# Patient Record
Sex: Female | Born: 1945 | Race: Black or African American | Hispanic: No | Marital: Single | Smoking: Never smoker
Health system: Southern US, Community
[De-identification: ages and names within clinical notes are randomized; demographics above are authoritative.]

## PROBLEM LIST (undated history)

## (undated) DIAGNOSIS — F419 Anxiety disorder, unspecified: Secondary | ICD-10-CM

## (undated) DIAGNOSIS — E785 Hyperlipidemia, unspecified: Secondary | ICD-10-CM

## (undated) DIAGNOSIS — N189 Chronic kidney disease, unspecified: Secondary | ICD-10-CM

## (undated) DIAGNOSIS — F32A Depression, unspecified: Secondary | ICD-10-CM

## (undated) DIAGNOSIS — C801 Malignant (primary) neoplasm, unspecified: Secondary | ICD-10-CM

## (undated) DIAGNOSIS — Z5189 Encounter for other specified aftercare: Secondary | ICD-10-CM

## (undated) DIAGNOSIS — D649 Anemia, unspecified: Secondary | ICD-10-CM

## (undated) DIAGNOSIS — I10 Essential (primary) hypertension: Secondary | ICD-10-CM

## (undated) DIAGNOSIS — F329 Major depressive disorder, single episode, unspecified: Secondary | ICD-10-CM

## (undated) DIAGNOSIS — N764 Abscess of vulva: Secondary | ICD-10-CM

## (undated) HISTORY — DX: Encounter for other specified aftercare: Z51.89

## (undated) HISTORY — PX: DILATION AND CURETTAGE OF UTERUS: SHX78

## (undated) HISTORY — PX: INCISION AND DRAINAGE: SHX5863

## (undated) HISTORY — PX: ABLATION: SHX5711

## (undated) HISTORY — DX: Abscess of vulva: N76.4

## (undated) HISTORY — DX: Essential (primary) hypertension: I10

---

## 1978-03-06 DIAGNOSIS — Z5189 Encounter for other specified aftercare: Secondary | ICD-10-CM

## 1978-03-06 HISTORY — DX: Encounter for other specified aftercare: Z51.89

## 1990-03-06 DIAGNOSIS — N189 Chronic kidney disease, unspecified: Secondary | ICD-10-CM

## 1990-03-06 HISTORY — PX: KIDNEY SURGERY: SHX687

## 1990-03-06 HISTORY — DX: Chronic kidney disease, unspecified: N18.9

## 2001-11-01 ENCOUNTER — Other Ambulatory Visit: Admission: RE | Admit: 2001-11-01 | Discharge: 2001-11-01 | Payer: Self-pay | Admitting: *Deleted

## 2005-03-22 ENCOUNTER — Ambulatory Visit: Payer: Self-pay | Admitting: Internal Medicine

## 2005-03-28 ENCOUNTER — Ambulatory Visit: Payer: Self-pay | Admitting: Internal Medicine

## 2005-04-07 ENCOUNTER — Ambulatory Visit: Payer: Self-pay | Admitting: Family Medicine

## 2005-04-14 ENCOUNTER — Ambulatory Visit: Payer: Self-pay | Admitting: Internal Medicine

## 2005-04-21 ENCOUNTER — Ambulatory Visit: Payer: Self-pay | Admitting: Internal Medicine

## 2005-04-21 LAB — CONVERTED CEMR LAB
Cholesterol: 267 mg/dL
LDL Cholesterol: 186 mg/dL
Pap Smear: NORMAL

## 2005-05-19 ENCOUNTER — Ambulatory Visit: Payer: Self-pay | Admitting: Internal Medicine

## 2005-06-21 ENCOUNTER — Ambulatory Visit: Payer: Self-pay | Admitting: *Deleted

## 2005-06-21 ENCOUNTER — Ambulatory Visit: Payer: Self-pay | Admitting: Internal Medicine

## 2005-06-22 ENCOUNTER — Ambulatory Visit: Payer: Self-pay | Admitting: Internal Medicine

## 2005-06-26 ENCOUNTER — Ambulatory Visit: Payer: Self-pay | Admitting: Internal Medicine

## 2005-10-06 ENCOUNTER — Ambulatory Visit: Payer: Self-pay | Admitting: Internal Medicine

## 2005-10-31 ENCOUNTER — Ambulatory Visit: Payer: Self-pay | Admitting: Family Medicine

## 2005-11-24 ENCOUNTER — Ambulatory Visit: Payer: Self-pay | Admitting: Internal Medicine

## 2006-06-27 ENCOUNTER — Ambulatory Visit: Payer: Self-pay | Admitting: Internal Medicine

## 2006-10-24 ENCOUNTER — Encounter (INDEPENDENT_AMBULATORY_CARE_PROVIDER_SITE_OTHER): Payer: Self-pay | Admitting: Internal Medicine

## 2006-10-24 DIAGNOSIS — F329 Major depressive disorder, single episode, unspecified: Secondary | ICD-10-CM

## 2006-10-24 DIAGNOSIS — F411 Generalized anxiety disorder: Secondary | ICD-10-CM | POA: Insufficient documentation

## 2006-11-12 ENCOUNTER — Telehealth (INDEPENDENT_AMBULATORY_CARE_PROVIDER_SITE_OTHER): Payer: Self-pay | Admitting: *Deleted

## 2006-11-21 ENCOUNTER — Encounter (INDEPENDENT_AMBULATORY_CARE_PROVIDER_SITE_OTHER): Payer: Self-pay | Admitting: *Deleted

## 2006-12-17 ENCOUNTER — Ambulatory Visit: Payer: Self-pay | Admitting: Internal Medicine

## 2006-12-17 LAB — CONVERTED CEMR LAB
BUN: 17 mg/dL (ref 6–23)
CO2: 25 meq/L (ref 19–32)
Chloride: 100 meq/L (ref 96–112)
Creatinine, Ser: 0.97 mg/dL (ref 0.40–1.20)
Glucose, Bld: 81 mg/dL (ref 70–99)
LDL Cholesterol: 147 mg/dL — ABNORMAL HIGH (ref 0–99)
Potassium: 3.9 meq/L (ref 3.5–5.3)
Triglycerides: 76 mg/dL (ref <150)
VLDL: 15 mg/dL (ref 0–40)

## 2007-06-20 ENCOUNTER — Ambulatory Visit: Payer: Self-pay | Admitting: Internal Medicine

## 2007-06-20 LAB — CONVERTED CEMR LAB
ALT: 15 units/L (ref 0–35)
AST: 20 units/L (ref 0–37)
Calcium: 9.3 mg/dL (ref 8.4–10.5)
Chloride: 100 meq/L (ref 96–112)
Creatinine, Ser: 1.03 mg/dL (ref 0.40–1.20)
Sodium: 140 meq/L (ref 135–145)
Total Bilirubin: 0.5 mg/dL (ref 0.3–1.2)
Total CHOL/HDL Ratio: 3.7
Total Protein: 9.1 g/dL — ABNORMAL HIGH (ref 6.0–8.3)
VLDL: 22 mg/dL (ref 0–40)

## 2007-06-27 ENCOUNTER — Ambulatory Visit: Payer: Self-pay | Admitting: Internal Medicine

## 2007-07-25 ENCOUNTER — Ambulatory Visit: Payer: Self-pay | Admitting: Internal Medicine

## 2007-09-24 ENCOUNTER — Ambulatory Visit: Payer: Self-pay | Admitting: Internal Medicine

## 2008-03-31 ENCOUNTER — Ambulatory Visit: Payer: Self-pay | Admitting: Internal Medicine

## 2008-03-31 LAB — CONVERTED CEMR LAB
HDL: 48 mg/dL (ref >39)
Hemoglobin: 11.7 g/dL — ABNORMAL LOW (ref 12.0–15.0)
LDL Cholesterol: 92 mg/dL (ref 0–99)
Lymphs Abs: 1 10*3/uL (ref 0.7–4.0)
Monocytes Relative: 11 % (ref 3–12)
Neutro Abs: 2.2 10*3/uL (ref 1.7–7.7)
Neutrophils Relative %: 60 % (ref 43–77)
RBC: 4.12 M/uL (ref 3.87–5.11)
Total CHOL/HDL Ratio: 3.5
Triglycerides: 143 mg/dL (ref <150)
VLDL: 29 mg/dL (ref 0–40)
WBC: 3.6 10*3/uL — ABNORMAL LOW (ref 4.0–10.5)

## 2008-04-08 ENCOUNTER — Ambulatory Visit: Payer: Self-pay | Admitting: Internal Medicine

## 2008-05-04 ENCOUNTER — Ambulatory Visit (HOSPITAL_COMMUNITY): Admission: RE | Admit: 2008-05-04 | Discharge: 2008-05-04 | Payer: Self-pay | Admitting: Internal Medicine

## 2008-08-27 ENCOUNTER — Emergency Department (HOSPITAL_COMMUNITY): Admission: EM | Admit: 2008-08-27 | Discharge: 2008-08-27 | Payer: Self-pay | Admitting: Emergency Medicine

## 2009-05-06 ENCOUNTER — Ambulatory Visit: Payer: Self-pay | Admitting: Internal Medicine

## 2009-05-06 LAB — CONVERTED CEMR LAB
ALT: 18 units/L (ref 0–35)
AST: 21 units/L (ref 0–37)
Alkaline Phosphatase: 53 units/L (ref 39–117)
Creatinine, Ser: 1.01 mg/dL (ref 0.40–1.20)
Microalb, Ur: 4.26 mg/dL — ABNORMAL HIGH (ref 0.00–1.89)
Total Bilirubin: 0.5 mg/dL (ref 0.3–1.2)

## 2009-10-11 ENCOUNTER — Ambulatory Visit: Payer: Self-pay | Admitting: Internal Medicine

## 2010-01-24 IMAGING — CT CT NECK W/ CM
3 series · 16 of 33 positions shown, 19 images · IV contrast (100 ML OMNI 300)
Comparison: None

CLINICAL DATA: Dysphagia.  Sore throat.

CT NECK WITH CONTRAST
TECHNIQUE: Multidetector CT imaging of the neck was performed with
intravenous contrast.
Contrast: 100 ml Vmnipaque-4UU IV

[Series 2: 2cc/(id)/1cc/(id) · axial · 0.40mm/px · z∈[-268,-28]mm · 8 of 76 slices shown, 10 images]
[im 6/76  soft-tissue]
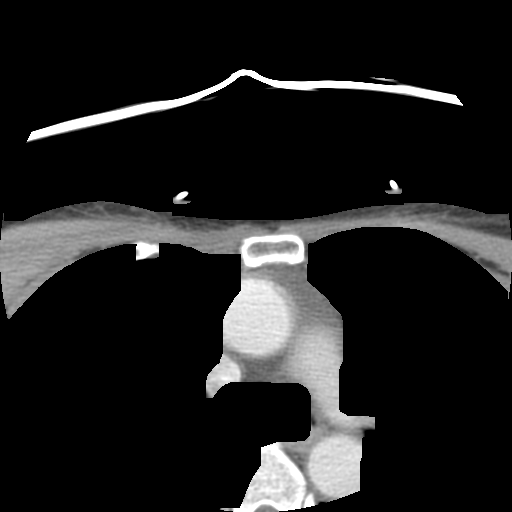
[im 6/76  bone]
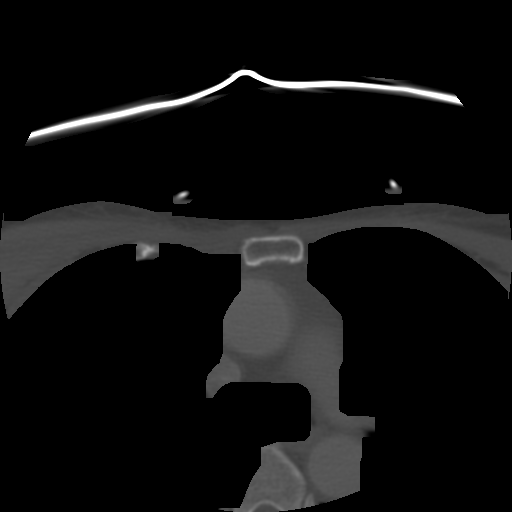
[im 18/76  bone]
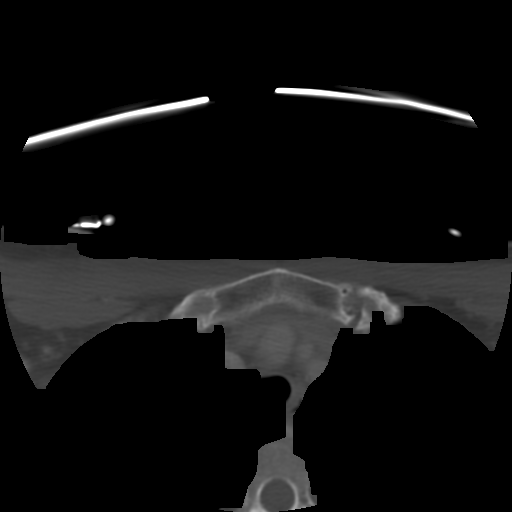
[im 24/76  bone]
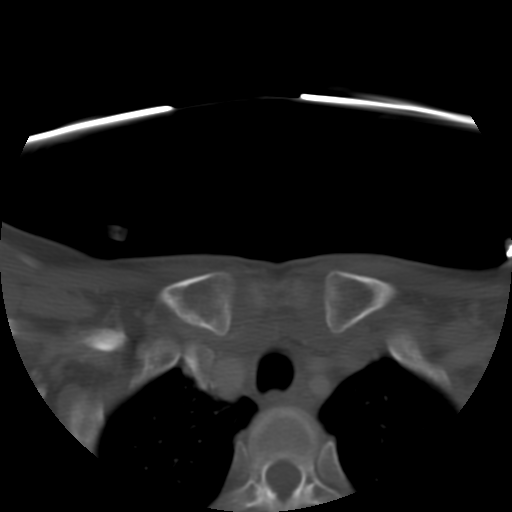
[im 35/76  bone]
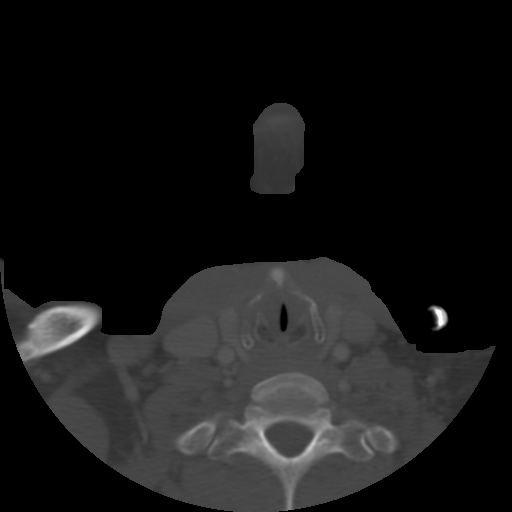
[im 41/76  soft-tissue]
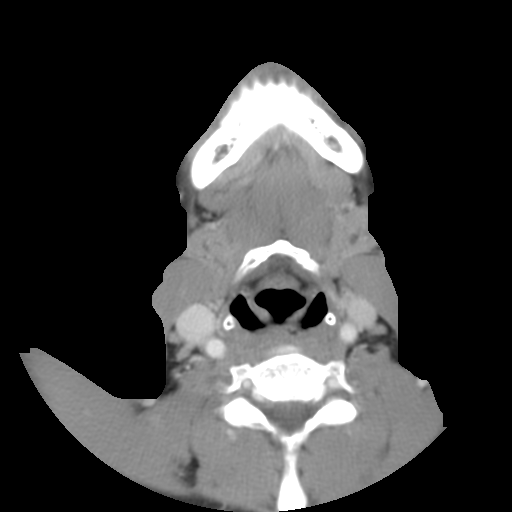
[im 41/76  bone]
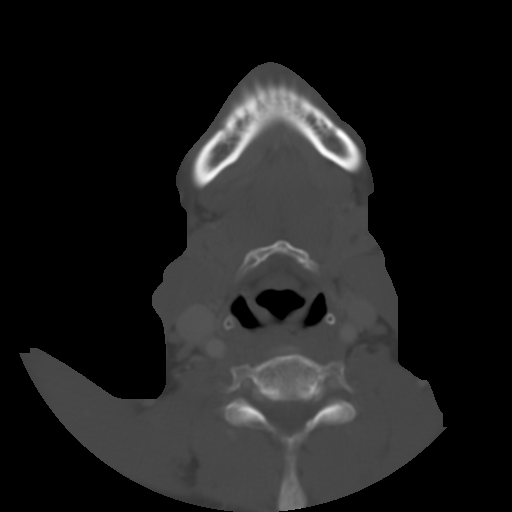
[im 52/76  bone]
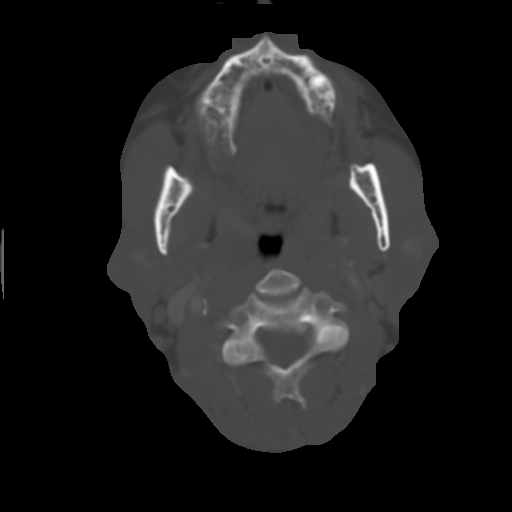
[im 58/76  bone]
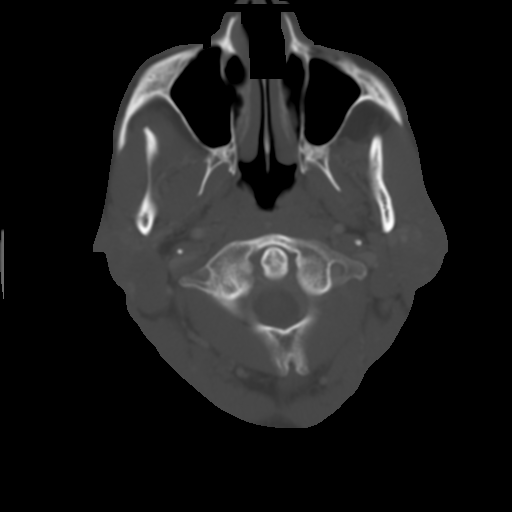
[im 70/76  bone]
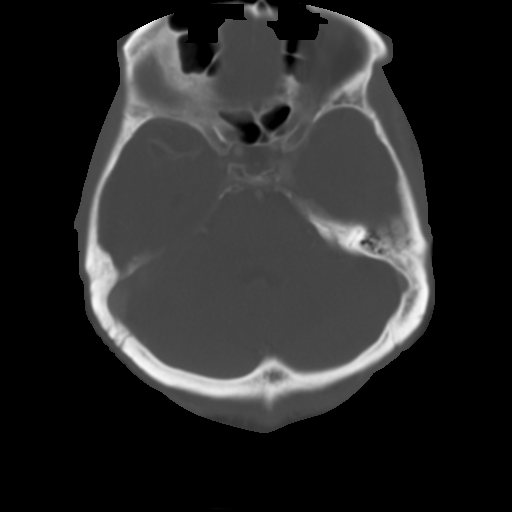

[Series 300: coronals · coronal · 0.56mm/px · 3 of 92 slices shown]
[im 19/92  bone]
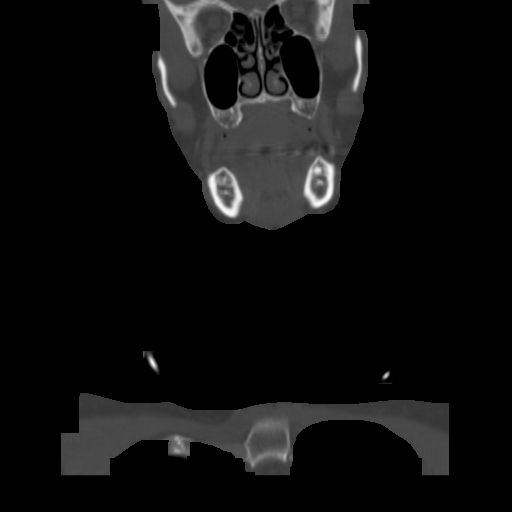
[im 37/92  bone]
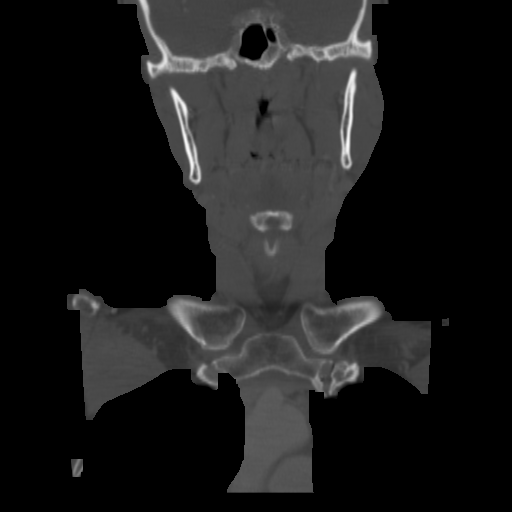
[im 55/92  bone]
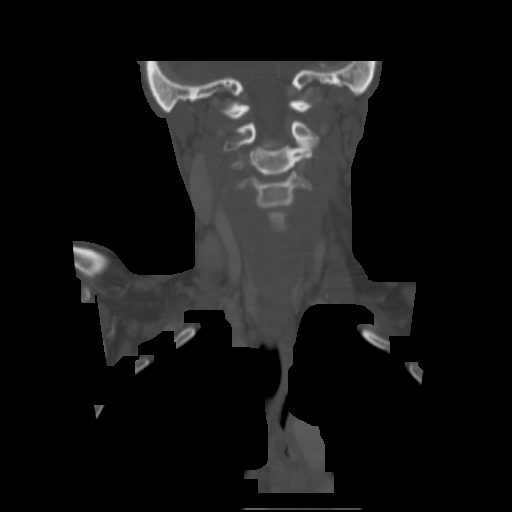

[Series 301: sagittals · sagittal · 0.56mm/px · 5 of 74 slices shown, 6 images]
[im 25/74  bone]
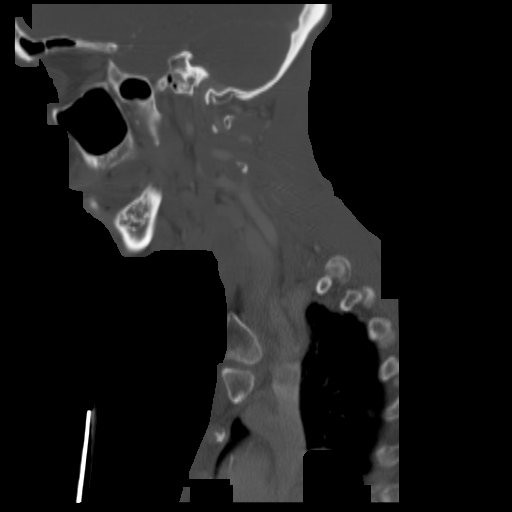
[im 31/74  bone]
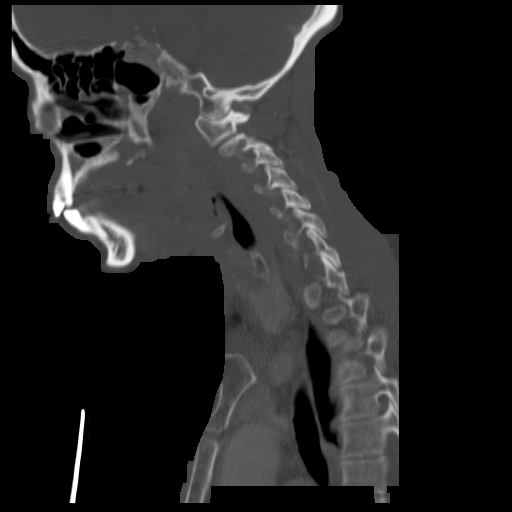
[im 37/74  soft-tissue]
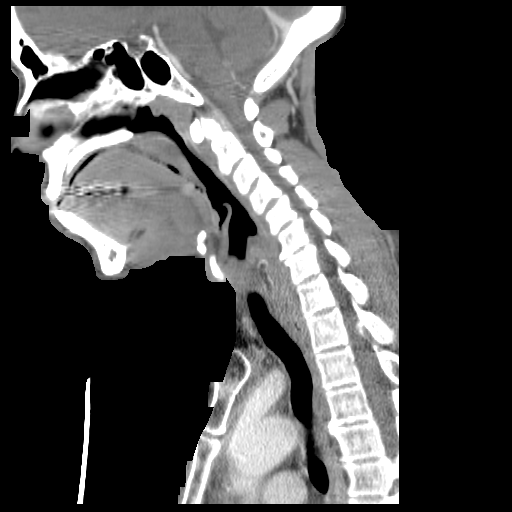
[im 37/74  bone]
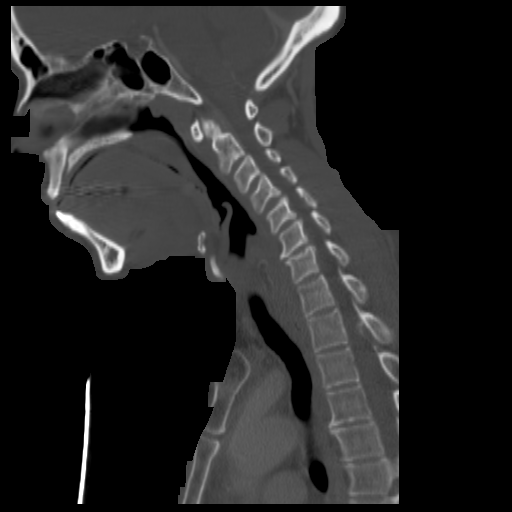
[im 43/74  bone]
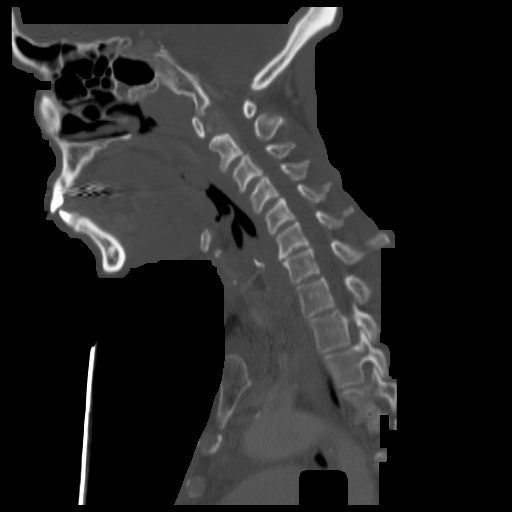
[im 49/74  bone]
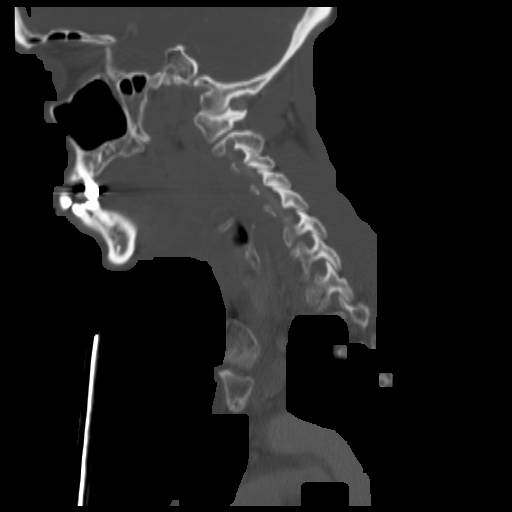

[16 of 33 positions shown; findings below may reference images not displayed]

FINDINGS: No mass or abnormal fluid collection.  Slightly prominent
sized internal jugular nodes and posterior triangle nodes possibly
due to hyperplasia/reactive nodes.  Slightly prominent sized nodes
in the left supraclavicular region.  Also prominent nodes in the
prevascular space just lateral to the transverse aortic arch.
Unremarkable appearance of the nasopharynx and oropharynx. No
tonsillar abnormality.  No glottic or paraglottic abnormality.
IMPRESSION: Mild adenopathy as described above.  No evidence of deep space
infection.

## 2010-06-13 LAB — RAPID STREP SCREEN (MED CTR MEBANE ONLY): Streptococcus, Group A Screen (Direct): NEGATIVE

## 2010-06-13 LAB — POCT I-STAT, CHEM 8
BUN: 11 mg/dL (ref 6–23)
Creatinine, Ser: 1.3 mg/dL — ABNORMAL HIGH (ref 0.4–1.2)
Hemoglobin: 13.3 g/dL (ref 12.0–15.0)
Potassium: 3.6 mEq/L (ref 3.5–5.1)
Sodium: 137 mEq/L (ref 135–145)
TCO2: 32 mmol/L (ref 0–100)

## 2010-06-13 LAB — DIFFERENTIAL
Basophils Relative: 0 % (ref 0–1)
Eosinophils Absolute: 0 10*3/uL (ref 0.0–0.7)
Lymphs Abs: 0.8 10*3/uL (ref 0.7–4.0)
Monocytes Relative: 11 % (ref 3–12)
Neutro Abs: 4 10*3/uL (ref 1.7–7.7)
Neutrophils Relative %: 75 % (ref 43–77)

## 2010-06-13 LAB — CBC
Hemoglobin: 12.1 g/dL (ref 12.0–15.0)
MCV: 92.5 fL (ref 78.0–100.0)
Platelets: 206 10*3/uL (ref 150–400)

## 2010-07-11 ENCOUNTER — Other Ambulatory Visit (HOSPITAL_COMMUNITY): Payer: Self-pay | Admitting: Family Medicine

## 2010-07-11 DIAGNOSIS — Z1231 Encounter for screening mammogram for malignant neoplasm of breast: Secondary | ICD-10-CM

## 2010-07-18 ENCOUNTER — Emergency Department (HOSPITAL_COMMUNITY)
Admission: EM | Admit: 2010-07-18 | Discharge: 2010-07-19 | Disposition: A | Discharge status: Home / Self Care | Payer: Medicare Other | Attending: Emergency Medicine | Admitting: Emergency Medicine

## 2010-07-18 ENCOUNTER — Emergency Department (HOSPITAL_COMMUNITY): Payer: Medicare Other

## 2010-07-18 DIAGNOSIS — D259 Leiomyoma of uterus, unspecified: Secondary | ICD-10-CM | POA: Insufficient documentation

## 2010-07-18 DIAGNOSIS — R142 Eructation: Secondary | ICD-10-CM | POA: Insufficient documentation

## 2010-07-18 DIAGNOSIS — I10 Essential (primary) hypertension: Secondary | ICD-10-CM | POA: Insufficient documentation

## 2010-07-18 DIAGNOSIS — R109 Unspecified abdominal pain: Secondary | ICD-10-CM | POA: Insufficient documentation

## 2010-07-18 DIAGNOSIS — I517 Cardiomegaly: Secondary | ICD-10-CM | POA: Insufficient documentation

## 2010-07-18 DIAGNOSIS — R141 Gas pain: Secondary | ICD-10-CM | POA: Insufficient documentation

## 2010-07-18 DIAGNOSIS — E7800 Pure hypercholesterolemia, unspecified: Secondary | ICD-10-CM | POA: Insufficient documentation

## 2010-07-18 LAB — HEPATIC FUNCTION PANEL
ALT: 18 U/L (ref 0–35)
AST: 25 U/L (ref 0–37)
Albumin: 3.8 g/dL (ref 3.5–5.2)
Alkaline Phosphatase: 67 U/L (ref 39–117)
Total Bilirubin: 0.3 mg/dL (ref 0.3–1.2)

## 2010-07-18 LAB — BASIC METABOLIC PANEL
CO2: 30 mEq/L (ref 19–32)
Calcium: 9.5 mg/dL (ref 8.4–10.5)
Chloride: 97 mEq/L (ref 96–112)
GFR calc Af Amer: 48 mL/min — ABNORMAL LOW (ref >60)
Glucose, Bld: 111 mg/dL — ABNORMAL HIGH (ref 70–99)
Potassium: 3.4 mEq/L — ABNORMAL LOW (ref 3.5–5.1)
Sodium: 137 mEq/L (ref 135–145)

## 2010-07-18 LAB — DIFFERENTIAL
Basophils Absolute: 0 10*3/uL (ref 0.0–0.1)
Basophils Relative: 0 % (ref 0–1)
Lymphocytes Relative: 44 % (ref 12–46)
Neutro Abs: 1.4 10*3/uL — ABNORMAL LOW (ref 1.7–7.7)
Neutrophils Relative %: 42 % — ABNORMAL LOW (ref 43–77)

## 2010-07-18 LAB — CBC
HCT: 42.7 % (ref 36.0–46.0)
Hemoglobin: 14.5 g/dL (ref 12.0–15.0)
RBC: 4.74 MIL/uL (ref 3.87–5.11)
WBC: 3.4 10*3/uL — ABNORMAL LOW (ref 4.0–10.5)

## 2010-07-18 LAB — POCT PREGNANCY, URINE: Preg Test, Ur: NEGATIVE

## 2010-07-19 LAB — URINALYSIS, ROUTINE W REFLEX MICROSCOPIC
Ketones, ur: NEGATIVE mg/dL
pH: 6.5 (ref 5.0–8.0)

## 2010-07-19 LAB — URINE MICROSCOPIC-ADD ON

## 2010-07-22 ENCOUNTER — Ambulatory Visit (HOSPITAL_COMMUNITY): Payer: Medicare Other

## 2012-01-03 ENCOUNTER — Ambulatory Visit (INDEPENDENT_AMBULATORY_CARE_PROVIDER_SITE_OTHER): Payer: Medicare Other | Admitting: Obstetrics and Gynecology

## 2012-01-03 ENCOUNTER — Encounter: Payer: Self-pay | Admitting: Obstetrics and Gynecology

## 2012-01-03 VITALS — BP 120/70 | Wt 135.0 lb

## 2012-01-03 DIAGNOSIS — Z719 Counseling, unspecified: Secondary | ICD-10-CM

## 2012-01-03 DIAGNOSIS — D649 Anemia, unspecified: Secondary | ICD-10-CM

## 2012-01-03 DIAGNOSIS — E559 Vitamin D deficiency, unspecified: Secondary | ICD-10-CM

## 2012-01-03 DIAGNOSIS — E039 Hypothyroidism, unspecified: Secondary | ICD-10-CM

## 2012-01-03 DIAGNOSIS — Z79899 Other long term (current) drug therapy: Secondary | ICD-10-CM

## 2012-01-03 DIAGNOSIS — E876 Hypokalemia: Secondary | ICD-10-CM

## 2012-01-03 DIAGNOSIS — L910 Hypertrophic scar: Secondary | ICD-10-CM

## 2012-01-03 DIAGNOSIS — E785 Hyperlipidemia, unspecified: Secondary | ICD-10-CM

## 2012-01-03 LAB — HEPATIC FUNCTION PANEL
Albumin: 4.2 g/dL (ref 3.5–5.2)
Total Protein: 8.4 g/dL — ABNORMAL HIGH (ref 6.0–8.3)

## 2012-01-03 LAB — LIPID PANEL
HDL: 59 mg/dL (ref >39)
LDL Cholesterol: 106 mg/dL — ABNORMAL HIGH (ref 0–99)
Triglycerides: 81 mg/dL (ref <150)
VLDL: 16 mg/dL (ref 0–40)

## 2012-01-03 LAB — CBC WITH DIFFERENTIAL/PLATELET
Basophils Absolute: 0 10*3/uL (ref 0.0–0.1)
HCT: 44.3 % (ref 36.0–46.0)
Lymphocytes Relative: 32 % (ref 12–46)
Lymphs Abs: 0.9 10*3/uL (ref 0.7–4.0)
Monocytes Absolute: 0.4 10*3/uL (ref 0.1–1.0)
Neutro Abs: 1.4 10*3/uL — ABNORMAL LOW (ref 1.7–7.7)
Platelets: 291 10*3/uL (ref 150–400)
RBC: 5 MIL/uL (ref 3.87–5.11)
RDW: 13.9 % (ref 11.5–15.5)
WBC: 2.7 10*3/uL — ABNORMAL LOW (ref 4.0–10.5)

## 2012-01-03 LAB — BASIC METABOLIC PANEL
Chloride: 99 mEq/L (ref 96–112)
Potassium: 3.5 mEq/L (ref 3.5–5.3)

## 2012-01-03 LAB — TSH: TSH: 2.466 u[IU]/mL (ref 0.350–4.500)

## 2012-01-03 MED ORDER — LIDOCAINE HCL 2 % EX GEL: Freq: Two times a day (BID) | CUTANEOUS | Status: DC | PRN

## 2012-01-03 NOTE — Progress Notes (Signed)
Vaginal cyst.  Pt c/o pain on right side of her vulva.  No f/c BP 120/70  Wt 135 lb (61.236 kg) Physical Examination: General appearance - alert, well appearing, and in no distress and anxious Pelvic - normal external genitalia, vulva, vagina, cervix, uterus and adnexa, VULVA: vulvar mass on right labiu majorum  It is solid and mildly tender, VAGINA: normal appearing vagina with normal color and discharge, no lesions, CERVIX: normal appearing cervix without discharge or lesions  right vulvar keloid Pt states it was draining and asked me to attempt to drain it.   Betadine used for prep.  1% lidocaine used for anesthesia.   attepted to drain area pt concerned abt and nothin drained from that area The keloid is about 1 cm in width and 4 c in length She was told the options are obs, vs removal and injection with steroid vs referral to dermatology Lidocaine jelly prn RT 1 month for AEX.  She will decide @ that visit Pt desires to have her labs drawn here that was recommended by a pcp

## 2012-01-03 NOTE — Patient Instructions (Signed)
Scar Minimization  You will have a scar anytime you have surgery and a cut is made in the skin or you have something removed from your skin (mole, skin cancer, cyst). Although scars are unavoidable following surgery, there are ways to minimize their appearance.  It is important to follow all the instructions you receive from your caregiver about wound care. How your wound heals will influence the appearance of your scar. If you do not follow the wound care instructions as directed, complications such as infection may occur. Wound instructions include keeping the wound clean, moist, and not letting the wound form a scab. Some people form scars that are raised and lumpy (hypertrophic) or larger than the initial wound (keloidal).  HOME CARE INSTRUCTIONS   · Follow wound care instructions as directed.  · Keep the wound clean by washing it with soap and water.  · Keep the wound moist with provided antibiotic cream or petroleum jelly until completely healed. Moisten twice a day for about 2 weeks.  · Get stitches (sutures) taken out at the scheduled time.  · Avoid touching or manipulating your wound unless needed. Wash your hands thoroughly before and after touching your wound.  · Follow all restrictions such as limits on exercise or work. This depends on where your scar is located.  · Keep the scar protected from sunburn. Cover the scar with sunscreen/sunblock with SPF 30 or higher.  · Gently massage the scar using a circular motion to help minimize the appearance of the scar. Do this only after the wound has closed and all the sutures have been removed.  · For hypertrophic or keloidal scars, there are several ways to treat and minimize their appearance. Methods include compression therapy, intralesional corticosteroids, laser therapy, or surgery. These methods are performed by your caregiver.  Remember that the scar may appear lighter or darker than your normal skin color. This difference in color should even out with  time.  SEEK MEDICAL CARE IF:   · You have a fever.  · You develop signs of infection such as pain, redness, pus, and warmth.  · You have questions or concerns.  Document Released: 08/10/2009 Document Revised: 05/15/2011 Document Reviewed: 08/10/2009  ExitCare® Patient Information ©2013 [employer].

## 2012-01-04 LAB — VITAMIN D 25 HYDROXY (VIT D DEFICIENCY, FRACTURES): Vit D, 25-Hydroxy: 15 ng/mL — ABNORMAL LOW (ref 30–89)

## 2012-01-09 ENCOUNTER — Telehealth: Payer: Self-pay

## 2012-01-09 MED ORDER — VITAMIN D3 1.25 MG (50000 UT) PO CAPS: 1.0000 | ORAL_CAPSULE | ORAL | Status: DC

## 2012-01-09 NOTE — Telephone Encounter (Signed)
Message copied by Rolla Plate on Tue Jan 09, 2012  3:38 PM ------      Message from: Jaymes Graff      Created: Mon Jan 08, 2012  8:54 PM       Please replace vitamin D per protocol.  Tell pt to f/u with PCP for LDL and creatinine.

## 2012-01-09 NOTE — Telephone Encounter (Signed)
Spoke with pt rgd labs informed ldl abnl follow up with pcp and and vit d low rx for vit d sent to pharm pt want scopy of labs informed copy left up front pt voice understanding

## 2012-01-11 ENCOUNTER — Ambulatory Visit: Payer: Self-pay | Admitting: Obstetrics and Gynecology

## 2012-03-05 ENCOUNTER — Ambulatory Visit: Payer: Medicare Other | Admitting: Obstetrics and Gynecology

## 2012-04-01 ENCOUNTER — Ambulatory Visit: Payer: Self-pay | Admitting: Obstetrics and Gynecology

## 2012-04-19 ENCOUNTER — Ambulatory Visit: Payer: Self-pay | Admitting: Obstetrics and Gynecology

## 2012-05-14 ENCOUNTER — Telehealth: Payer: Self-pay | Admitting: Obstetrics and Gynecology

## 2012-05-14 NOTE — Telephone Encounter (Signed)
Returned pt's call.States had bump on vagnal area that ws irritated by clothing.Has stopped wearing underwear and tenderness is decreased but noticed it is now spreading. Offered pt appt 05/14/12 but pt declined. Will only see Dr ND. Advised numerous times is not recommended she wait. Pt still declines appt. Mel Bates to schedule as previously offered. Pt to call if desires sooner appt.

## 2012-09-11 ENCOUNTER — Other Ambulatory Visit (HOSPITAL_COMMUNITY): Payer: Self-pay | Admitting: Cardiovascular Disease

## 2012-09-11 DIAGNOSIS — Z1231 Encounter for screening mammogram for malignant neoplasm of breast: Secondary | ICD-10-CM

## 2012-09-13 ENCOUNTER — Ambulatory Visit (HOSPITAL_COMMUNITY)
Admission: RE | Admit: 2012-09-13 | Discharge: 2012-09-13 | Disposition: A | Discharge status: Home / Self Care | Payer: Medicare Other | Source: Ambulatory Visit | Attending: Cardiovascular Disease | Admitting: Cardiovascular Disease

## 2012-09-13 DIAGNOSIS — Z1231 Encounter for screening mammogram for malignant neoplasm of breast: Secondary | ICD-10-CM | POA: Insufficient documentation

## 2013-08-05 ENCOUNTER — Other Ambulatory Visit: Payer: Self-pay | Admitting: Obstetrics and Gynecology

## 2013-08-14 ENCOUNTER — Encounter (HOSPITAL_COMMUNITY): Payer: Self-pay | Admitting: Pharmacist

## 2013-08-15 NOTE — Patient Instructions (Addendum)
   Your procedure is scheduled on:  Tuesday, June 16  Enter through the [employer] [facility] at:  8:45 AM Pick up the phone at the desk and dial [phone] and inform us of your arrival.  Please call this number if you have any problems the morning of surgery: [phone]  Remember: Do not eat r drink after midnight: Monday Take these medicines the morning of surgery with a SIP OF WATER:  Bupropion, citalopram, hctz and xanax if needed  Do not wear jewelry, make-up, or FINGER nail polish No metal in your hair or on your body. Do not wear lotions, powders, perfumes.  You may wear deodorant.  Do not bring valuables to the hospital. Contacts, dentures or bridgework may not be worn into surgery.  Patients discharged on the day of surgery will not be allowed to drive home.  Home with sister Arlana Hove cell [phone].

## 2013-08-18 ENCOUNTER — Encounter (HOSPITAL_COMMUNITY): Payer: Self-pay

## 2013-08-18 ENCOUNTER — Encounter (HOSPITAL_COMMUNITY)
Admission: RE | Admit: 2013-08-18 | Discharge: 2013-08-18 | Disposition: A | Discharge status: Home / Self Care | Payer: Medicare Other | Source: Ambulatory Visit | Attending: Obstetrics and Gynecology | Admitting: Obstetrics and Gynecology

## 2013-08-18 HISTORY — DX: Depression, unspecified: F32.A

## 2013-08-18 HISTORY — DX: Chronic kidney disease, unspecified: N18.9

## 2013-08-18 HISTORY — DX: Anemia, unspecified: D64.9

## 2013-08-18 HISTORY — DX: Anxiety disorder, unspecified: F41.9

## 2013-08-18 HISTORY — DX: Malignant (primary) neoplasm, unspecified: C80.1

## 2013-08-18 HISTORY — DX: Hyperlipidemia, unspecified: E78.5

## 2013-08-18 HISTORY — DX: Major depressive disorder, single episode, unspecified: F32.9

## 2013-08-18 LAB — BASIC METABOLIC PANEL
BUN: 23 mg/dL (ref 6–23)
CO2: 28 meq/L (ref 19–32)
Calcium: 9.5 mg/dL (ref 8.4–10.5)
Chloride: 91 mEq/L — ABNORMAL LOW (ref 96–112)
Creatinine, Ser: 1.28 mg/dL — ABNORMAL HIGH (ref 0.50–1.10)
GFR calc Af Amer: 50 mL/min — ABNORMAL LOW (ref >90)
GFR calc non Af Amer: 43 mL/min — ABNORMAL LOW (ref >90)
Glucose, Bld: 68 mg/dL — ABNORMAL LOW (ref 70–99)
POTASSIUM: 3.9 meq/L (ref 3.7–5.3)
SODIUM: 133 meq/L — AB (ref 137–147)

## 2013-08-18 LAB — CBC
HCT: 42.5 % (ref 36.0–46.0)
HEMOGLOBIN: 14.5 g/dL (ref 12.0–15.0)
MCH: 30.5 pg (ref 26.0–34.0)
MCHC: 34.1 g/dL (ref 30.0–36.0)
MCV: 89.5 fL (ref 78.0–100.0)
Platelets: 232 10*3/uL (ref 150–400)
RBC: 4.75 MIL/uL (ref 3.87–5.11)
RDW: 12.8 % (ref 11.5–15.5)
WBC: 4.2 10*3/uL (ref 4.0–10.5)

## 2013-08-18 NOTE — Pre-Procedure Instructions (Signed)
SDS BB History Log given to [facility] for history of blood transfusion.

## 2013-08-18 NOTE — H&P (Signed)
Stacey Larson is an 68 y.o. female. Presenting for removal of vulvar keloid.  She has said that it is painful and has been infected over the years and she has not had any relief with abx and topical treatments.  She was offered a referral for eval by a dermatologist but declined.    Pertinent Gynecological History: Menses: post-menopausal Bleeding: na Contraception: none DES exposure: denies Blood transfusions: none Sexually transmitted diseases: no past history Previous GYN Procedures: DNC  Last mammogram: normal Date: 2014 Last pap: normal Date: 2014 OB History: G1, P0   Menstrual History: Menarche age: na  No LMP recorded. Patient is postmenopausal.    Past Medical History  Diagnosis Date  . Vulvar abscess   . Hypertension   . Blood transfusion without reported diagnosis 1980    [place]  . Hyperlipidemia   . Anxiety   . Depression   . Chronic kidney disease 1992    right kidney removed  . Cancer     right kidney  . Anemia     Hx    Past Surgical History  Procedure Laterality Date  . Kidney surgery  1992    right kidney removed   . Incision and drainage    . Dilation and curettage of uterus    . Ablation      No family history on file.  Social History:  reports that she has never smoked. She has never used smokeless tobacco. She reports that she drinks alcohol. She reports that she does not use illicit drugs.  Allergies:  Allergies  Allergen Reactions  . Codeine Nausea And Vomiting    No prescriptions prior to admission    ROS  There were no vitals taken for this visit. Physical Exam BP 106/69  Pulse 88  Temp(Src) 98.6 F (37 C) (Oral)  Resp 20  SpO2 100% Physical Examination: General appearance - alert, well appearing, and in no distress Chest - clear to auscultation, no wheezes, rales or rhonchi, symmetric air entry Heart - normal rate and regular rhythm Abdomen - soft, nontender, nondistended, no masses or organomegaly Pelvic - VULVA: normal  appearing vulva with no masses, tenderness or lesions, vulvar mass large 10 exophytic keloid notede, VAGINA: normal appearing vagina with normal color and discharge, no lesions, atrophic, CERVIX: normal appearing cervix without discharge or lesions, UTERUS: uterus is normal size, shape, consistency and nontender, ADNEXA: normal adnexa in size, nontender and no masses Extremities - peripheral pulses normal, no pedal edema, no clubbing or cyanosis   Results for orders placed during the hospital encounter of 08/18/13 (from the past 24 hour(s))  BASIC METABOLIC PANEL     Status: Abnormal   Collection Time    08/18/13  3:37 PM      Result Value Ref Range   Sodium 133 (*) 137 - 147 mEq/L   Potassium 3.9  3.7 - 5.3 mEq/L   Chloride 91 (*) 96 - 112 mEq/L   CO2 28  19 - 32 mEq/L   Glucose, Bld 68 (*) 70 - 99 mg/dL   BUN 23  6 - 23 mg/dL   Creatinine, Ser 1.28 (*) 0.50 - 1.10 mg/dL   Calcium 9.5  8.4 - 10.5 mg/dL   GFR calc non Af Amer 43 (*) >90 mL/min   GFR calc Af Amer 50 (*) >90 mL/min  CBC     Status: None   Collection Time    08/18/13  3:37 PM      Result Value Ref Range  WBC 4.2  4.0 - 10.5 K/uL   RBC 4.75  3.87 - 5.11 MIL/uL   Hemoglobin 14.5  12.0 - 15.0 g/dL   HCT 42.5  36.0 - 46.0 %   MCV 89.5  78.0 - 100.0 fL   MCH 30.5  26.0 - 34.0 pg   MCHC 34.1  30.0 - 36.0 g/dL   RDW 12.8  11.5 - 15.5 %   Platelets 232  150 - 400 K/uL    No results found.  Assessment/Plan: Vulvar keloid Will attempt removal today Plan to use kenalog to help prevent reoccurence Pt understands the risks are but not limited to bleeding, infection, reoccurence of keloid, break down of incision Date of Initial H&P: today  History reviewed, patient examined, no change in status, stable for surgery.   [place] A 08/18/2013, 6:50 PM

## 2013-08-18 NOTE — Pre-Procedure Instructions (Signed)
Dr. Lyndle Herrlich reviewed patient's history, EKG and meds.  [place] for surgery.  No orders given.

## 2013-08-19 ENCOUNTER — Ambulatory Visit (HOSPITAL_COMMUNITY): Payer: Medicare Other | Admitting: Certified Registered"

## 2013-08-19 ENCOUNTER — Encounter (HOSPITAL_COMMUNITY): Payer: Medicare Other | Admitting: Certified Registered"

## 2013-08-19 ENCOUNTER — Encounter (HOSPITAL_COMMUNITY)
Admission: RE | Disposition: A | Discharge status: Home / Self Care | Payer: Self-pay | Source: Ambulatory Visit | Attending: Obstetrics and Gynecology

## 2013-08-19 ENCOUNTER — Encounter (HOSPITAL_COMMUNITY): Payer: Self-pay | Admitting: Certified Registered"

## 2013-08-19 ENCOUNTER — Ambulatory Visit (HOSPITAL_COMMUNITY)
Admission: RE | Admit: 2013-08-19 | Discharge: 2013-08-19 | Disposition: A | Discharge status: Home / Self Care | Payer: Medicare Other | Source: Ambulatory Visit | Attending: Obstetrics and Gynecology | Admitting: Obstetrics and Gynecology

## 2013-08-19 DIAGNOSIS — F3289 Other specified depressive episodes: Secondary | ICD-10-CM | POA: Insufficient documentation

## 2013-08-19 DIAGNOSIS — F329 Major depressive disorder, single episode, unspecified: Secondary | ICD-10-CM | POA: Insufficient documentation

## 2013-08-19 DIAGNOSIS — L910 Hypertrophic scar: Secondary | ICD-10-CM | POA: Insufficient documentation

## 2013-08-19 DIAGNOSIS — I129 Hypertensive chronic kidney disease with stage 1 through stage 4 chronic kidney disease, or unspecified chronic kidney disease: Secondary | ICD-10-CM | POA: Insufficient documentation

## 2013-08-19 DIAGNOSIS — N189 Chronic kidney disease, unspecified: Secondary | ICD-10-CM | POA: Insufficient documentation

## 2013-08-19 DIAGNOSIS — L723 Sebaceous cyst: Secondary | ICD-10-CM | POA: Insufficient documentation

## 2013-08-19 DIAGNOSIS — N9089 Other specified noninflammatory disorders of vulva and perineum: Secondary | ICD-10-CM

## 2013-08-19 HISTORY — PX: VULVAR LESION REMOVAL: SHX5391

## 2013-08-19 SURGERY — VULVAR LESION
Anesthesia: Monitor Anesthesia Care | Site: Vulva | Laterality: Left

## 2013-08-19 MED ORDER — IODINE STRONG (LUGOLS) 5 % PO SOLN
ORAL | Status: AC
  Filled 2013-08-19: qty 1

## 2013-08-19 MED ORDER — TRIAMCINOLONE ACETONIDE 10 MG/ML IJ SUSP
10.0000 mg | Freq: Once | INTRAMUSCULAR | Status: DC
Start: 2013-08-19 — End: 2013-08-19
  Filled 2013-08-19: qty 1

## 2013-08-19 MED ORDER — PROPOFOL 10 MG/ML IV EMUL
INTRAVENOUS | Status: DC | PRN
  Administered 2013-08-19: 20 mg via INTRAVENOUS
  Administered 2013-08-19: 30 mg via INTRAVENOUS
  Administered 2013-08-19 (×2): 20 mg via INTRAVENOUS
  Administered 2013-08-19: 30 mg via INTRAVENOUS

## 2013-08-19 MED ORDER — LIDOCAINE HCL 1 % IJ SOLN
INTRAMUSCULAR | Status: AC
  Filled 2013-08-19: qty 20

## 2013-08-19 MED ORDER — TRIAMCINOLONE ACETONIDE 40 MG/ML IJ SUSP
INTRAMUSCULAR | Status: DC | PRN
  Administered 2013-08-19: 40 mg via INTRAMUSCULAR

## 2013-08-19 MED ORDER — GLYCOPYRROLATE 0.2 MG/ML IJ SOLN
INTRAMUSCULAR | Status: AC
  Filled 2013-08-19: qty 1

## 2013-08-19 MED ORDER — FENTANYL CITRATE 0.05 MG/ML IJ SOLN: 25.0000 ug | INTRAMUSCULAR | Status: DC | PRN

## 2013-08-19 MED ORDER — SODIUM CHLORIDE 0.9 % IJ SOLN
INTRAMUSCULAR | Status: AC
  Filled 2013-08-19: qty 100

## 2013-08-19 MED ORDER — LACTATED RINGERS IV SOLN
INTRAVENOUS | Status: DC
  Administered 2013-08-19 (×2): via INTRAVENOUS

## 2013-08-19 MED ORDER — LIDOCAINE HCL (CARDIAC) 20 MG/ML IV SOLN
INTRAVENOUS | Status: AC
  Filled 2013-08-19: qty 5

## 2013-08-19 MED ORDER — LIDOCAINE HCL (CARDIAC) 20 MG/ML IV SOLN
INTRAVENOUS | Status: DC | PRN
  Administered 2013-08-19: 80 mg via INTRAVENOUS

## 2013-08-19 MED ORDER — KETOROLAC TROMETHAMINE 30 MG/ML IJ SOLN
INTRAMUSCULAR | Status: AC
Start: 2013-08-19 — End: 2013-08-19
  Filled 2013-08-19: qty 1

## 2013-08-19 MED ORDER — PROPOFOL 10 MG/ML IV EMUL
INTRAVENOUS | Status: AC
  Filled 2013-08-19: qty 20

## 2013-08-19 MED ORDER — DEXAMETHASONE SODIUM PHOSPHATE 10 MG/ML IJ SOLN
INTRAMUSCULAR | Status: AC
  Filled 2013-08-19: qty 1

## 2013-08-19 MED ORDER — FENTANYL CITRATE 0.05 MG/ML IJ SOLN
INTRAMUSCULAR | Status: AC
  Filled 2013-08-19: qty 2

## 2013-08-19 MED ORDER — IBUPROFEN 600 MG PO TABS: 600.0000 mg | ORAL_TABLET | Freq: Four times a day (QID) | ORAL | Status: AC | PRN

## 2013-08-19 MED ORDER — ONDANSETRON HCL 4 MG/2ML IJ SOLN
INTRAMUSCULAR | Status: AC
  Filled 2013-08-19: qty 2

## 2013-08-19 MED ORDER — ACETAMINOPHEN 160 MG/5ML PO SOLN: 325.0000 mg | ORAL | Status: DC | PRN

## 2013-08-19 MED ORDER — FENTANYL CITRATE 0.05 MG/ML IJ SOLN
INTRAMUSCULAR | Status: DC | PRN
  Administered 2013-08-19 (×2): 50 ug via INTRAVENOUS

## 2013-08-19 MED ORDER — ONDANSETRON HCL 4 MG/2ML IJ SOLN
INTRAMUSCULAR | Status: DC | PRN
  Administered 2013-08-19: 4 mg via INTRAVENOUS

## 2013-08-19 MED ORDER — ACETIC ACID 5 % SOLN
Status: AC
  Filled 2013-08-19: qty 500

## 2013-08-19 MED ORDER — MIDAZOLAM HCL 2 MG/2ML IJ SOLN
INTRAMUSCULAR | Status: DC | PRN
  Administered 2013-08-19 (×2): 1 mg via INTRAVENOUS

## 2013-08-19 MED ORDER — MIDAZOLAM HCL 2 MG/2ML IJ SOLN
INTRAMUSCULAR | Status: AC
  Filled 2013-08-19: qty 2

## 2013-08-19 MED ORDER — TRIAMCINOLONE ACETONIDE 40 MG/ML IJ SUSP
Freq: Once | INTRAMUSCULAR | Status: DC
  Filled 2013-08-19: qty 10

## 2013-08-19 MED ORDER — ACETAMINOPHEN 325 MG PO TABS: 325.0000 mg | ORAL_TABLET | ORAL | Status: DC | PRN

## 2013-08-19 MED ORDER — ONDANSETRON HCL 4 MG/2ML IJ SOLN
INTRAMUSCULAR | Status: AC
Start: 2013-08-19 — End: 2013-08-19
  Filled 2013-08-19: qty 2

## 2013-08-19 SURGICAL SUPPLY — 25 items
APPLICATOR COTTON TIP 6IN STRL (MISCELLANEOUS) IMPLANT
CLOTH BEACON ORANGE TIMEOUT ST (SAFETY) ×3 IMPLANT
CONTAINER PREFILL 10% NBF 15ML (MISCELLANEOUS) ×3 IMPLANT
COUNTER NEEDLE 1200 MAGNETIC (NEEDLE) IMPLANT
DRSG TELFA 3X8 NADH (GAUZE/BANDAGES/DRESSINGS) ×3 IMPLANT
EVACUATOR PREFILTER SMOKE (MISCELLANEOUS) IMPLANT
GLOVE BIO SURGEON STRL SZ 6.5 (GLOVE) ×2 IMPLANT
GLOVE BIO SURGEONS STRL SZ 6.5 (GLOVE) ×1
GLOVE BIOGEL PI IND STRL 7.0 (GLOVE) ×1 IMPLANT
GLOVE BIOGEL PI INDICATOR 7.0 (GLOVE) ×2
GOWN STRL REUS W/TWL LRG LVL3 (GOWN DISPOSABLE) ×6 IMPLANT
HOSE NS SMOKE EVAC 7/8 X6 (MISCELLANEOUS) IMPLANT
HOSE NS SMOKE EVAC 7/8 X6' (MISCELLANEOUS)
NS IRRIG 1000ML POUR BTL (IV SOLUTION) ×3 IMPLANT
PACK VAGINAL MINOR WOMEN LF (CUSTOM PROCEDURE TRAY) ×3 IMPLANT
PAD OB MATERNITY 4.3X12.25 (PERSONAL CARE ITEMS) ×3 IMPLANT
REDUCER FITTING SMOKE EVAC (MISCELLANEOUS) IMPLANT
SCOPETTES 8  STERILE (MISCELLANEOUS)
SCOPETTES 8 STERILE (MISCELLANEOUS) IMPLANT
SUT MNCRL AB 3-0 PS2 27 (SUTURE) ×3 IMPLANT
SUT VIC AB 3-0 SH 27 (SUTURE) ×4
SUT VIC AB 3-0 SH 27X BRD (SUTURE) ×2 IMPLANT
SYR 20CC LL (SYRINGE) ×3 IMPLANT
TOWEL OR 17X24 6PK STRL BLUE (TOWEL DISPOSABLE) ×6 IMPLANT
WATER STERILE IRR 1000ML POUR (IV SOLUTION) IMPLANT

## 2013-08-19 NOTE — Anesthesia Postprocedure Evaluation (Signed)
  Anesthesia Post-op Note  Patient: Stacey Larson  Procedure(s) Performed: Procedure(s): Excision of vulvar mass (Left) Patient is awake and responsive. Pain and nausea are reasonably well controlled. Vital signs are stable and clinically acceptable. Oxygen saturation is clinically acceptable. There are no apparent anesthetic complications at this time. Patient is ready for discharge.

## 2013-08-19 NOTE — Op Note (Signed)
Preoperative diagnosis: Were mass consistent with keloid  Post OpDiagnosis:  Same Procedure: removal of vulvar mass Surgeon: Dr. Charlesetta Garibaldi Estimated blood loss:100 cc Findings were about a 10 cm: Were mass on the left labium majora. Procedure in detail:  The patient was taken to the operating room and prepped and draped in the normal sterile fashion. The mass was grasped with a Allis clamp. A knife was used to remove the mass and any scar tissue surrounding it in the labium majora. There is 10 mg of Kenalog triamcinolone which was mixed with 100 cc of saline. This was then injected it into the patient's vulva.  Approximately 30 cc was used. The vulvar bed was made hemostatic with Bovie cautery. The subcutaneous  tissue was reapproximated with  Interrupted figure-of-eight sutures of 3-0 Vicryl.  Skin was reapproximated with 3-0 Monocryl subarticular.  Hemostasis was assured sponge lap and needle counts were correct patient her room in stable condition.

## 2013-08-19 NOTE — Transfer of Care (Signed)
Immediate Anesthesia Transfer of Care Note  Patient: Stacey Larson  Procedure(s) Performed: Procedure(s): Excision of vulvar mass (Left)  Patient Location: PACU  Anesthesia Type:MAC  Level of Consciousness: awake  Airway & Oxygen Therapy: Patient Spontanous Breathing and Patient connected to nasal cannula oxygen  Post-op Assessment: Report given to PACU RN, Post -op Vital signs reviewed and stable and Patient moving all extremities  Post vital signs: Reviewed and stable  Complications: No apparent anesthesia complications

## 2013-08-19 NOTE — Discharge Instructions (Signed)
Disorders of the Vulva It is important to know the outside (external) area of the female genitalia to properly examine yourself. The vulva or labia, sometimes also called the lips around the vagina, covers the pubic bone and surrounds the vagina. The larger or outer labia is called the labia majora. The inner or smaller labia is called the labia minora. The clitoris is at the top of the vulva. It is covered by a small area of tissue called the hood. Below the clitoris is the opening of the tube from the bladder, which is the urethral opening. Just below the urethral opening is the opening of the vagina. This area is called the vestibule. Inside the vestibule are bartholin and skene glands that lubricate the outside of the vagina during sexual intercourse. The perineum is the area that lies between the vagina and anus. You should examine your external genitalia once a month just as you do your self breast examination. SIGNS AND SYMPTOMS TO LOOK FOR DURING YOUR EXAMINATION:  Swelling.  Redness.  Bumps.  Blisters.  Black or white areas.  Itching.  Bleeding.  Burning. TYPES OF VULVAR PROBLEMS  Infections.  Yeast (fungus) is the most common infection that causes redness, swelling, itching and a thick white vaginal discharge. Women with diabetes, taking medicines that kill germs (antibiotics) or on birth control pills are at risk for yeast infection. This infection is treated with vaginal creams, suppositories and anti-itch cream for the vulva.  Genital warts (condyloma) are caused by the human papilloma virus. They are raised bumps that can itch, bleed, cause discomfort and sometimes appear in groups like cauliflower. This is a sexually transmitted disease (STD) caused by sexual contact. This can be treated with topical medication, freezing, electrocautery, laser or surgical removal.  Genital herpes is a virus that causes redness, swelling, blisters and ulcers that can cause itching and are  very painful. This is also a STD. There are medications to control the symptoms of herpes, but there is no cure. Once you have it, you keep getting it because the virus stays in your body.  Contact dermatitis. Contact dermatitis is an irritation of the vulva that can cause redness, swelling and itching. It can be caused by:  Perfumed toilet paper.  Deodorants.  Talcum powder.  Hygiene sprays.  Tampons.  Soaps and fabric softeners.  Wearing wet underwear and bathing suits too long.  Spermicide.  Condoms.  Diaphragms.  Poison ivy. Treatment will depend on eliminating the cause and treating the symptoms.  Vulvodynia.  Vulvodynia is "painful vulva." It includes burning, itching, irritation and a feeling of rawness or bruising of the vulva. Treating this problem depends on the cause and diagnosis. Treatment can take a long time.  Vulvar Dystrophy.  Vulvar Dystrophy is a disorder of the skin of the vulva. The skin can be too thick with hard raised patches or too thin skin showing wrinkles. Vulvar dystrophy can cause redness or white patches, itching and burning sensation. A biopsy may be needed to diagnose the problem. Treatment with creams or ointments depends on the diagnosis.  Vulvar Cancer.  Vulvar cancer is usually a form of squamus skin cancer. Other types of vulvar cancer like melenoma (a dark or black, irregular shaped mole that can bleed easily) and adenocarcinoma (red, itchy, scaly area that looks like eczema) are rare. Treatment is usually surgery to remove the cancerous area, the vulva and the lymph nodes in the groin. This can be done with or without radiation and chemotherapy. HOME   CARE INSTRUCTIONS   Look at your vulva and external genital area every month.  Follow and finish all treatment given to you by your caregiver.  Do not use perfumed or scented soaps, detergents, hygiene spray, talcum powder, tampons, douches or toilet paper.  Remove your tampon every 6  hours. Do not leave tampons in overnight.  Wear loose-fitting clothing.  Wear underwear that is cotton.  Avoid spermicidal creams or foams that irritate you. SEEK MEDICAL CARE IF:   You notice changes on your vulva such as redness, swelling, itching, color changes, bleeding, small bumps, blisters or any discomfort.  You develop an abnormal vaginal discharge.  You have painful sexual intercourse.  You notice swelling of the lymph nodes in your groin. Document Released: 08/09/2007 Document Revised: 05/15/2011 Document Reviewed: 05/23/2010 Exit[employer] Patient Information 2014 [employer].  

## 2013-08-19 NOTE — Anesthesia Preprocedure Evaluation (Addendum)
Anesthesia Evaluation  Patient identified by MRN, date of birth, ID band Patient awake    Reviewed: Allergy & Precautions, H&P , Patient's Chart, lab work & pertinent test results, reviewed documented beta blocker date and time   Airway Mallampati: II TM Distance: >3 FB Neck ROM: full    Dental no notable dental hx.    Pulmonary  breath sounds clear to auscultation  Pulmonary exam normal       Cardiovascular hypertension, On Medications Rhythm:regular Rate:Normal     Neuro/Psych    GI/Hepatic   Endo/Other    Renal/GU      Musculoskeletal   Abdominal   Peds  Hematology   Anesthesia Other Findings   Reproductive/Obstetrics                           Anesthesia Physical Anesthesia Plan  ASA: II  Anesthesia Plan: MAC   Post-op Pain Management:    Induction: Intravenous  Airway Management Planned: LMA, Mask and Natural Airway  Additional Equipment:   Intra-op Plan:   Post-operative Plan:   Informed Consent: I have reviewed the patients History and Physical, chart, labs and discussed the procedure including the risks, benefits and alternatives for the proposed anesthesia with the patient or authorized representative who has indicated his/her understanding and acceptance.   Dental Advisory Given  Plan Discussed with: CRNA and Surgeon  Anesthesia Plan Comments: (Discussed sedation and potential to need to place airway or ETT if warranted by clinical changes intra-operatively. We will start procedure as MAC.)        Anesthesia Quick Evaluation

## 2013-08-20 ENCOUNTER — Encounter (HOSPITAL_COMMUNITY): Payer: Self-pay | Admitting: Obstetrics and Gynecology

## 2013-12-05 ENCOUNTER — Ambulatory Visit (HOSPITAL_COMMUNITY): Payer: Medicare Other | Admitting: Psychiatry

## 2013-12-26 ENCOUNTER — Ambulatory Visit (HOSPITAL_COMMUNITY): Payer: Medicare Other | Admitting: Psychiatry

## 2014-01-05 ENCOUNTER — Encounter (HOSPITAL_COMMUNITY): Payer: Self-pay | Admitting: Obstetrics and Gynecology

## 2014-01-19 ENCOUNTER — Ambulatory Visit (HOSPITAL_COMMUNITY): Payer: Self-pay | Admitting: Psychiatry

## 2015-12-21 ENCOUNTER — Encounter: Payer: Self-pay | Admitting: Internal Medicine

## 2015-12-21 NOTE — Progress Notes (Signed)
This encounter was created in error - please disregard.
